# Patient Record
Sex: Female | Born: 1990 | Race: White | Hispanic: No | Marital: Single | Smoking: Never smoker
Health system: Southern US, Community
[De-identification: ages and names within clinical notes are randomized; demographics above are authoritative.]

---
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
## 2019-05-13 ENCOUNTER — Encounter: Payer: Self-pay | Admitting: Sports Medicine

## 2019-05-13 ENCOUNTER — Other Ambulatory Visit: Payer: Self-pay

## 2019-05-13 ENCOUNTER — Ambulatory Visit (INDEPENDENT_AMBULATORY_CARE_PROVIDER_SITE_OTHER): Payer: Self-pay | Admitting: Sports Medicine

## 2019-05-13 VITALS — BP 114/76 | HR 82 | Temp 97.3°F | Resp 16 | Ht 63.0 in | Wt 215.0 lb

## 2019-05-13 DIAGNOSIS — L600 Ingrowing nail: Secondary | ICD-10-CM

## 2019-05-13 DIAGNOSIS — M79675 Pain in left toe(s): Secondary | ICD-10-CM

## 2019-05-13 MED ORDER — NEOMYCIN-POLYMYXIN-HC 3.5-10000-1 OT SOLN: OTIC | 0 refills | Status: AC

## 2019-05-13 NOTE — Progress Notes (Signed)
Subjective: Pamela Bullock is a 28 y.o. female patient presents to office today complaining of a moderately painful incurvated, red, hot, swollen lateral nail border of the 1st toe on the left foot. This has been present for 2 weeks. Patient has treated this by Keflex by PCP. Patient denies fever/chills/nausea/vomitting/any other related constitutional symptoms at this time.  Review of Systems  All other systems reviewed and are negative.   There are no active problems to display for this patient.   Current Outpatient Medications on File Prior to Visit  Medication Sig Dispense Refill  . cephALEXin (KEFLEX) 500 MG capsule Take 500 mg by mouth 4 (four) times daily.     No current facility-administered medications on file prior to visit.     Allergies  Allergen Reactions  . Amoxicillin Other (See Comments)  . Omnicef [Cefdinir] Other (See Comments)    Objective:  Vitals:   05/13/19 0933  Weight: 215 lb (97.5 kg)  Height: 5\' 3"  (1.6 m)    General: Well developed, nourished, in no acute distress, alert and oriented x3   Dermatology: Skin is warm, dry and supple bilateral. Left hallux nail appears to be  severely incurvated with hyperkeratosis formation at the distal aspects of  The lateral nail border. (+) Erythema. (+) Edema. (-) serosanguous  drainage present. The remaining nails appear unremarkable at this time. There are no open sores, lesions or other signs of infection present.  Vascular: Dorsalis Pedis artery and Posterior Tibial artery pedal pulses are 2/4 bilateral with immedate capillary fill time. Pedal hair growth present. No lower extremity edema.   Neruologic: Grossly intact via light touch bilateral.  Musculoskeletal: Tenderness to palpation of the Left hallux lateral nail fold. Muscular strength within normal limits in all groups bilateral.   Assesement and Plan: Problem List Items Addressed This Visit    None    Visit Diagnoses    Ingrown nail    -   Primary   Toe pain, left          -Discussed treatment alternatives and plan of care; Explained permanent/temporary nail avulsion and post procedure course to patient. Patient elects for Left hallux at lateral margin - After a verbal and written consent, injected 3 ml of a 50:50 mixture of 2% plain  lidocaine and 0.5% plain marcaine in a normal hallux block fashion. Next, a  betadine prep was performed. Anesthesia was tested and found to be appropriate.  The offending Left hallux lateral nail border was then incised from the hyponychium to the epinychium. The offending nail border was removed and cleared from the field. The area was curretted for any remaining nail or spicules. Phenol application performed and the area was then flushed with alcohol and dressed with antibiotic cream and a dry sterile dressing. -Patient was instructed to leave the dressing intact for today and begin soaking  in a weak solution of betadine or Epsom salt and water tomorrow. Patient was instructed to soak for 15-20 minutes each day and apply corticosporin and a gauze or bandaid dressing each day. -Patient was instructed to monitor the toe for signs of infection and return to office if toe becomes red, hot or swollen. -Advised ice, elevation, and tylenol or motrin if needed for pain.  -Continue with Keflex until completed.  -Patient is to return in 2 weeks for follow up care/nail check or sooner if problems arise.  Asencion Islam, DPM

## 2019-05-13 NOTE — Progress Notes (Signed)
   Subjective:    Patient ID: Pamela Bullock, female    DOB: 1991-01-17, 28 y.o.   MRN: [id number]  HPI    Review of Systems  All other systems reviewed and are negative.      Objective:   Physical Exam        Assessment & Plan:

## 2019-05-13 NOTE — Patient Instructions (Signed)

## 2019-05-28 ENCOUNTER — Other Ambulatory Visit: Payer: Self-pay

## 2019-05-28 ENCOUNTER — Ambulatory Visit (INDEPENDENT_AMBULATORY_CARE_PROVIDER_SITE_OTHER): Payer: Medicaid Other | Admitting: Sports Medicine

## 2019-05-28 ENCOUNTER — Encounter: Payer: Self-pay | Admitting: Sports Medicine

## 2019-05-28 VITALS — Temp 98.1°F

## 2019-05-28 DIAGNOSIS — L600 Ingrowing nail: Secondary | ICD-10-CM

## 2019-05-28 DIAGNOSIS — M79675 Pain in left toe(s): Secondary | ICD-10-CM

## 2019-05-28 DIAGNOSIS — Z9889 Other specified postprocedural states: Secondary | ICD-10-CM

## 2019-05-28 NOTE — Progress Notes (Signed)
Subjective: Pamela Bullock is a 28 y.o. female patient returns to office today for follow up evaluation after having Left Hallux lateral permanent nail avulsion performed on (05-13-19). Patient has been soaking using epsom salt switched to Vinegar and applying topical antibiotic covered with bandaid daily. Patient denies fever/chills/nausea/vomitting/any other related constitutional symptoms at this time.  There are no active problems to display for this patient.   Current Outpatient Medications on File Prior to Visit  Medication Sig Dispense Refill  . cephALEXin (KEFLEX) 500 MG capsule Take 500 mg by mouth 4 (four) times daily.    Marland Kitchen neomycin-polymyxin-hydrocortisone (CORTISPORIN) OTIC solution Apply 2-3 drops to the ingrown toenail site twice daily. Cover with band-aid. 10 mL 0   No current facility-administered medications on file prior to visit.     Allergies  Allergen Reactions  . Amoxicillin Other (See Comments)  . Omnicef [Cefdinir] Other (See Comments)    Objective:  General: Well developed, nourished, in no acute distress, alert and oriented x3   Dermatology: Skin is warm, dry and supple bilateral. Left hallux lateral nail bed appears to be clean, dry, with mild fibrogranular tissue. (+) Faint erythema. (-) Edema. (-) serosanguous drainage present. The remaining nails appear unremarkable at this time. There are no other lesions or other signs of infection present.  Neurovascular status: Intact. No lower extremity swelling; No pain with calf compression bilateral.  Musculoskeletal: Decreased tenderness to palpation of the Left hallux lateral nail fold. Muscular strength within normal limits bilateral.   Assesement and Plan: Problem List Items Addressed This Visit    None    Visit Diagnoses    S/P nail surgery    -  Primary   Ingrown nail       Toe pain, left         -Examined patient  -Cleansed left hallux lateral nail fold and gently scrubbed with peroxide and  q-tip/curetted away eschar at site and applied antibiotic cream covered with bandaid.  -Discussed plan of care with patient. -Patient to now begin soaking in a weak solution of Epsom salt or vinegar and warm water once daily. Patient was instructed to soak for 15-20 minutes each day until the toe appears normal and there is no drainage, redness, tenderness, or swelling at the procedure site, and apply neosporin and a gauze or bandaid dressing each day as needed. May leave open to air at night. -Educated patient on long term care after nail surgery. -Patient was instructed to monitor the toe for reoccurrence and signs of infection; Patient advised to return to office or go to ER if toe becomes red, hot or swollen. -Patient is to return as needed or sooner if problems arise.  Asencion Islam, DPM

## 2019-05-28 NOTE — Patient Instructions (Signed)
